# Patient Record
Sex: Female | Born: 1980 | Race: White | Hispanic: No | Marital: Married | Smoking: Never smoker
Health system: Southern US, Community
[De-identification: ages and names within clinical notes are randomized; demographics above are authoritative.]

## PROBLEM LIST (undated history)

## (undated) DIAGNOSIS — T4145XA Adverse effect of unspecified anesthetic, initial encounter: Secondary | ICD-10-CM

## (undated) DIAGNOSIS — Z87442 Personal history of urinary calculi: Secondary | ICD-10-CM

## (undated) DIAGNOSIS — R112 Nausea with vomiting, unspecified: Secondary | ICD-10-CM

## (undated) DIAGNOSIS — T8859XA Other complications of anesthesia, initial encounter: Secondary | ICD-10-CM

## (undated) DIAGNOSIS — Z9889 Other specified postprocedural states: Secondary | ICD-10-CM

## (undated) DIAGNOSIS — N209 Urinary calculus, unspecified: Secondary | ICD-10-CM

---

## 2006-06-09 ENCOUNTER — Emergency Department (HOSPITAL_COMMUNITY): Admission: EM | Admit: 2006-06-09 | Discharge: 2006-06-09 | Payer: Self-pay | Admitting: Emergency Medicine

## 2006-08-12 ENCOUNTER — Ambulatory Visit (HOSPITAL_COMMUNITY)
Admission: RE | Admit: 2006-08-12 | Discharge: 2006-08-12 | Payer: Self-pay | Admitting: Physical Medicine and Rehabilitation

## 2007-04-13 IMAGING — CR DG ANKLE COMPLETE 3+V*R*
3 series · 3 of 3 positions shown · non-contrast
Comparison: 06/09/06.

CLINICAL DATA: 25 year-old with right ankle pain.
 RIGHT ANKLE ? 3 VIEW:

[view not recorded (1 of 3)]
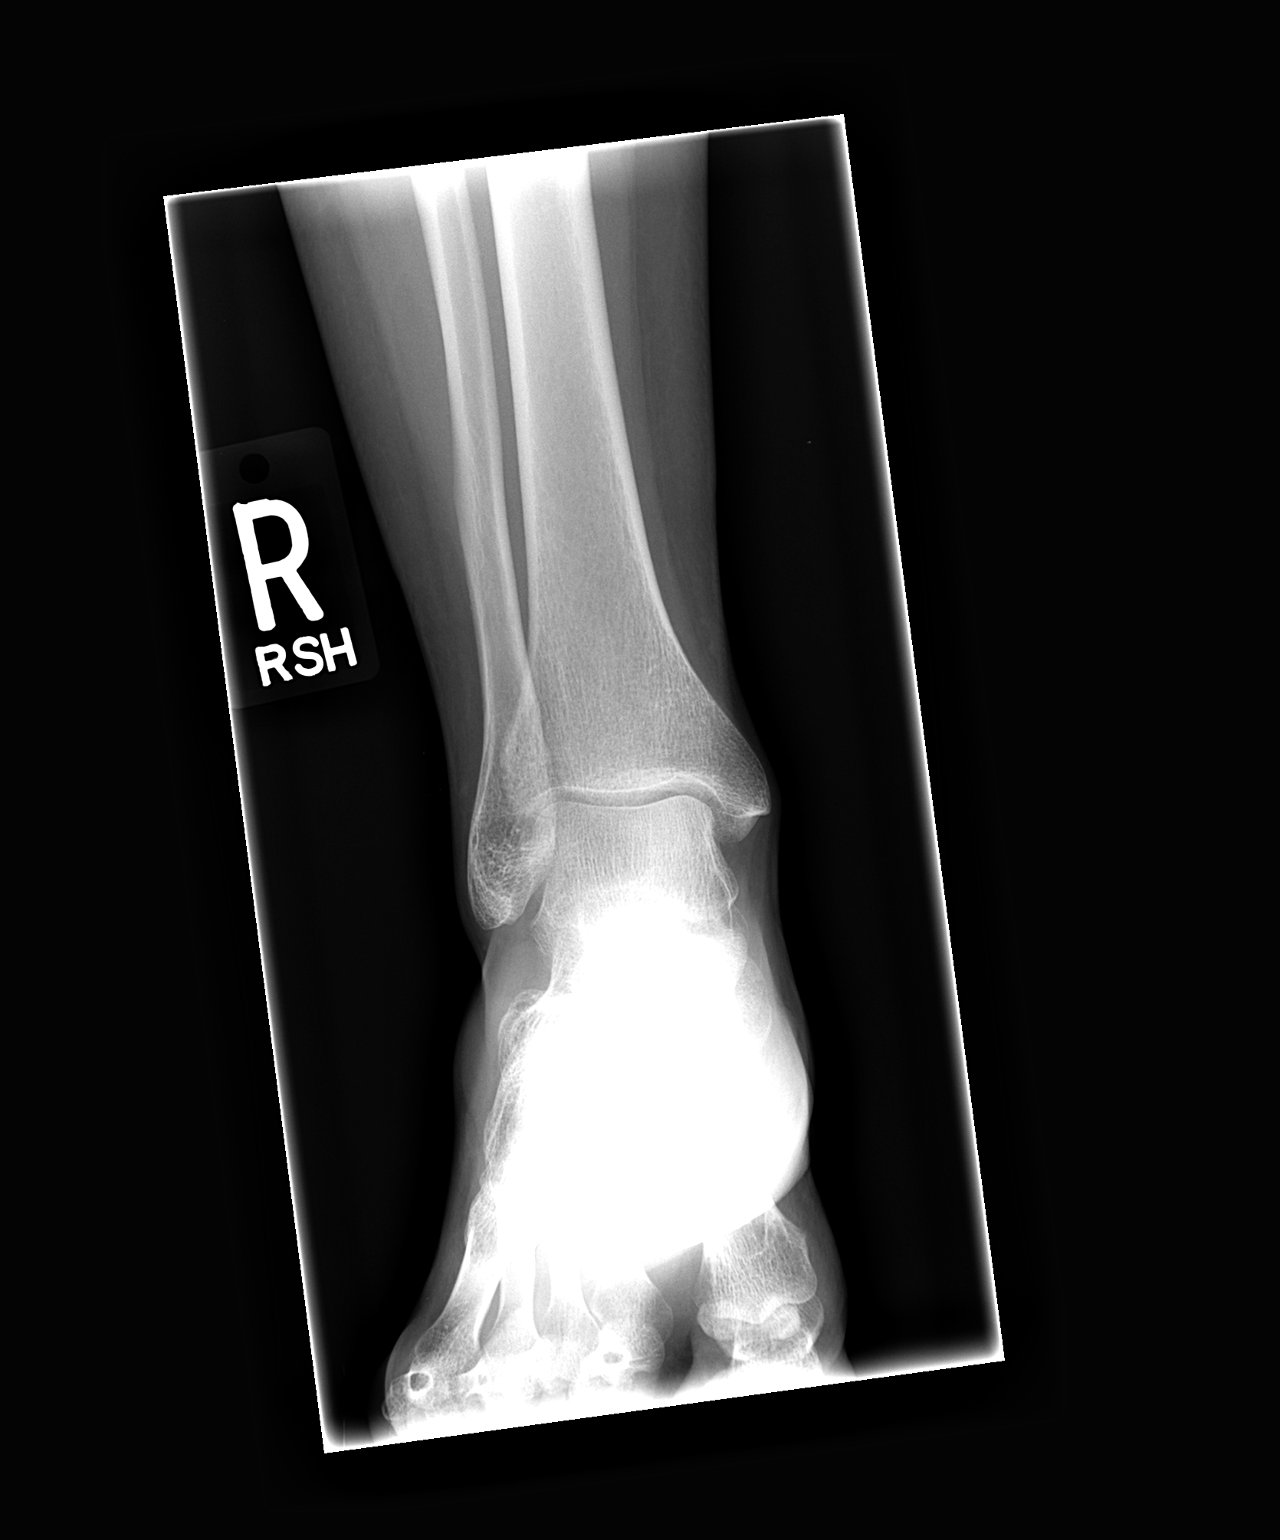

[view not recorded (2 of 3)]
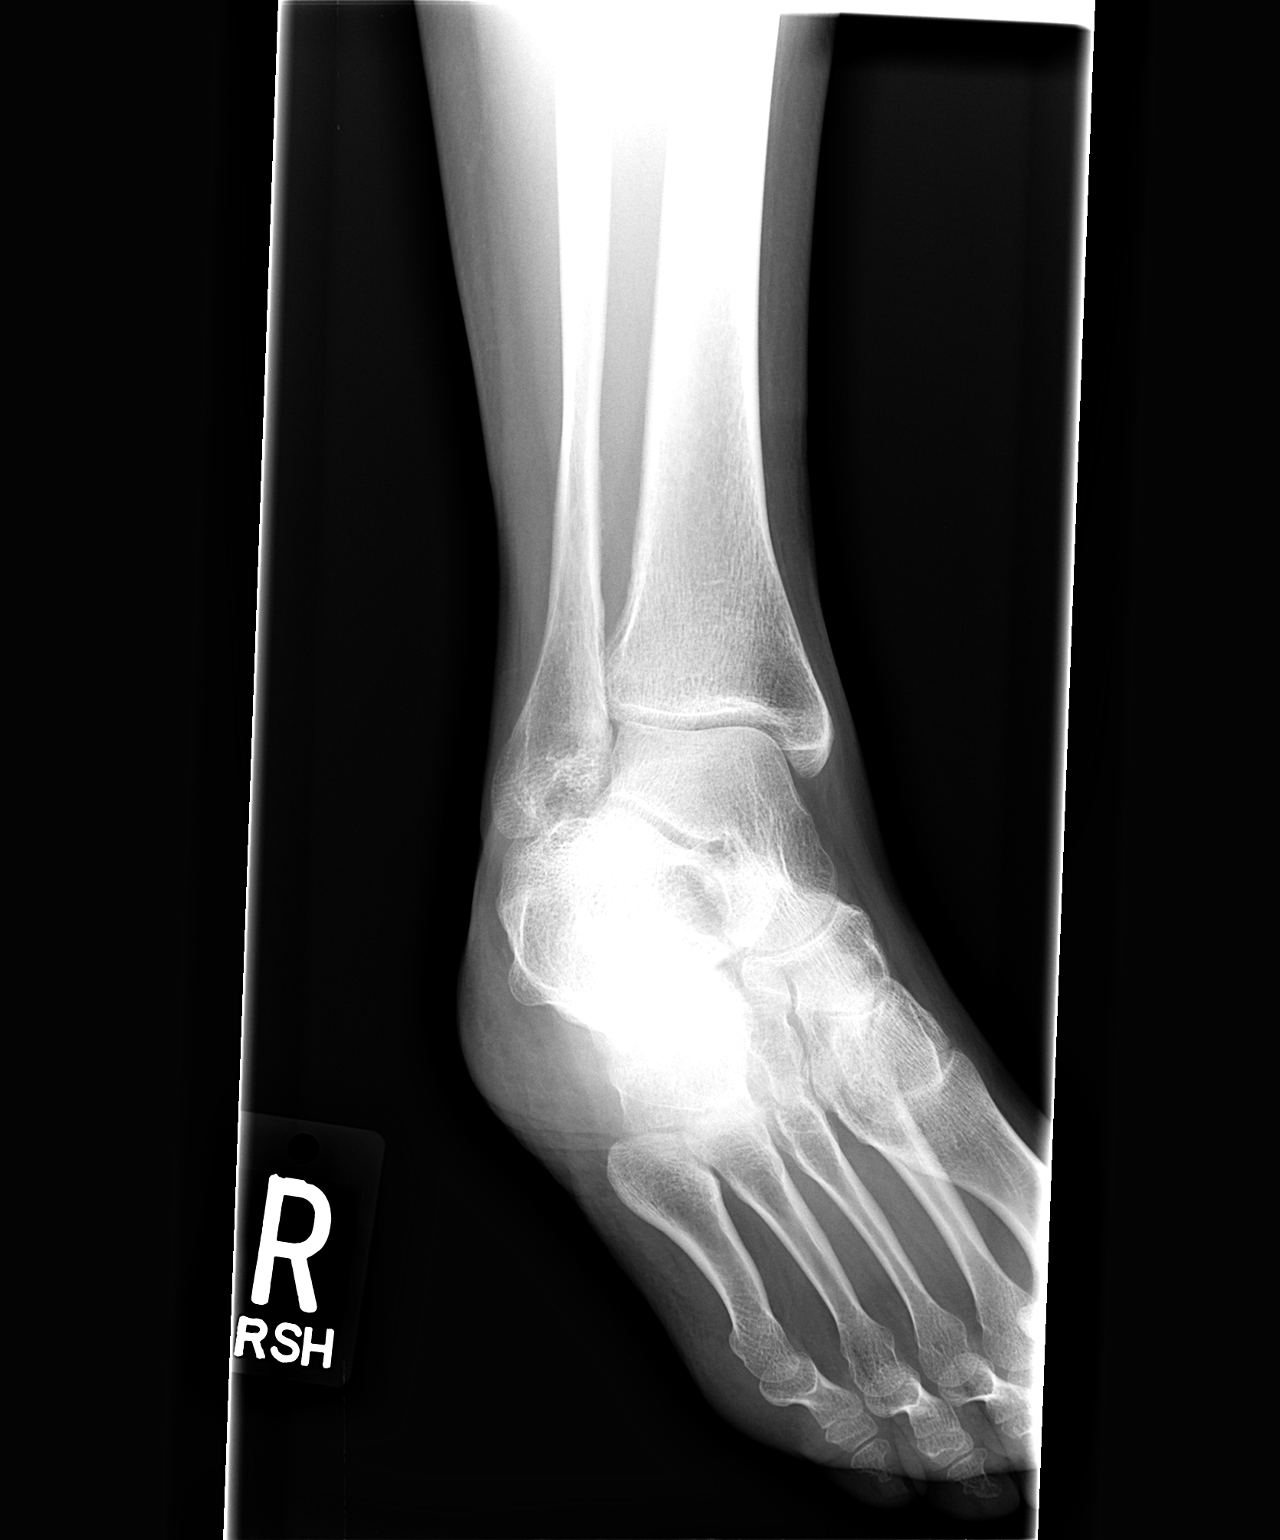

[view not recorded (3 of 3)]
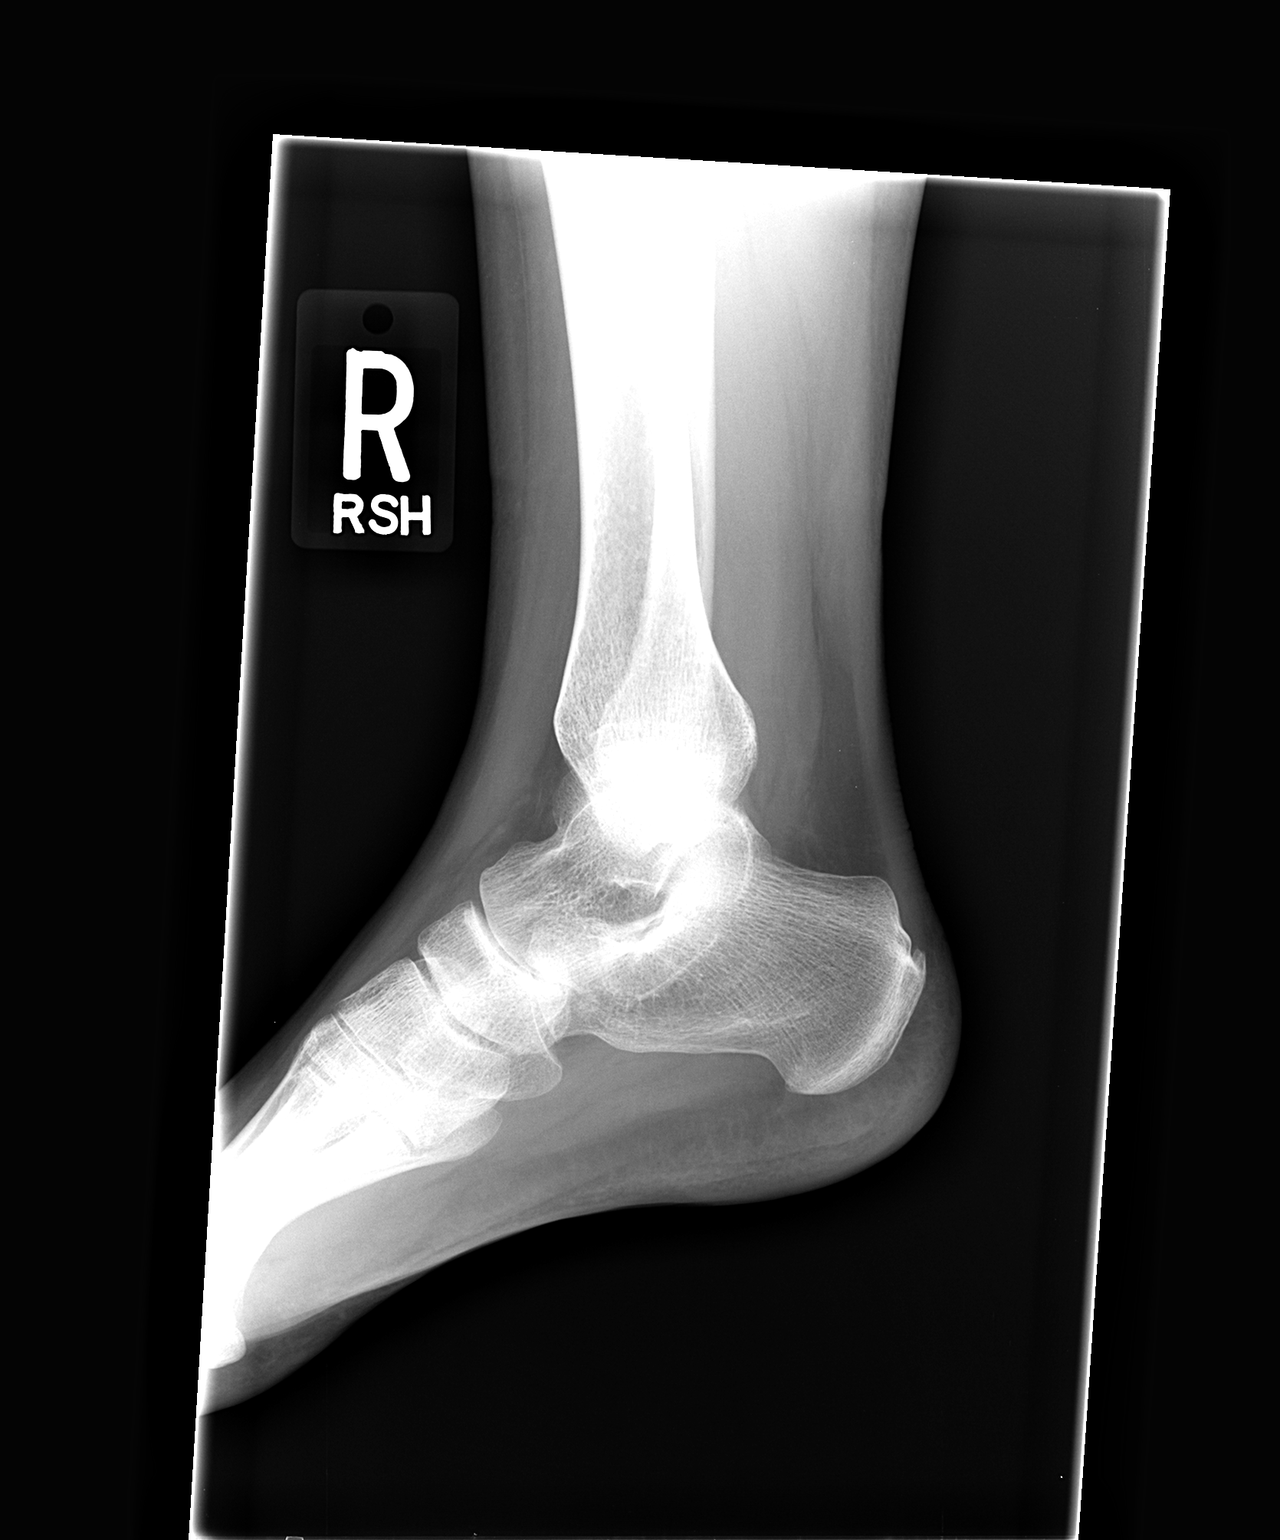

[3 of 3 positions shown; findings below may reference images not displayed]

FINDINGS: The ankle mortise is maintained.  No definite fractures are seen. No osteochondral lesions.
IMPRESSION: No acute bony findings or significant degenerative changes.

## 2018-09-20 ENCOUNTER — Other Ambulatory Visit: Payer: Self-pay

## 2018-09-20 ENCOUNTER — Emergency Department (HOSPITAL_COMMUNITY): Payer: Medicaid Other

## 2018-09-20 ENCOUNTER — Emergency Department (HOSPITAL_COMMUNITY): Payer: Medicaid Other | Admitting: Registered Nurse

## 2018-09-20 ENCOUNTER — Encounter (HOSPITAL_COMMUNITY): Admission: EM | Disposition: A | Discharge status: Home / Self Care | Payer: Self-pay | Source: Home / Self Care

## 2018-09-20 ENCOUNTER — Inpatient Hospital Stay: Admit: 2018-09-20 | Payer: Self-pay | Admitting: Urology

## 2018-09-20 ENCOUNTER — Encounter (HOSPITAL_COMMUNITY): Payer: Self-pay | Admitting: Urology

## 2018-09-20 ENCOUNTER — Observation Stay (HOSPITAL_COMMUNITY)
Admission: EM | Admit: 2018-09-20 | Discharge: 2018-09-21 | Disposition: A | Discharge status: Home / Self Care | Payer: Medicaid Other | Attending: Urology | Admitting: Urology

## 2018-09-20 DIAGNOSIS — N390 Urinary tract infection, site not specified: Secondary | ICD-10-CM | POA: Diagnosis present

## 2018-09-20 DIAGNOSIS — N136 Pyonephrosis: Secondary | ICD-10-CM | POA: Diagnosis not present

## 2018-09-20 DIAGNOSIS — N201 Calculus of ureter: Secondary | ICD-10-CM | POA: Diagnosis present

## 2018-09-20 HISTORY — DX: Urinary calculus, unspecified: N20.9

## 2018-09-20 HISTORY — PX: CYSTOSCOPY WITH RETROGRADE PYELOGRAM, URETEROSCOPY AND STENT PLACEMENT: SHX5789

## 2018-09-20 SURGERY — CYSTOURETEROSCOPY, WITH RETROGRADE PYELOGRAM AND STENT INSERTION
Anesthesia: General | Site: Ureter | Laterality: Left

## 2018-09-20 MED ORDER — MIDAZOLAM HCL 5 MG/5ML IJ SOLN
INTRAMUSCULAR | Status: DC | PRN
  Administered 2018-09-20: 2 mg via INTRAVENOUS

## 2018-09-20 MED ORDER — SODIUM CHLORIDE 0.9 % IV SOLN
INTRAVENOUS | Status: DC | PRN
  Administered 2018-09-20: 21:00:00 via INTRAVENOUS

## 2018-09-20 MED ORDER — LIDOCAINE 2% (20 MG/ML) 5 ML SYRINGE
INTRAMUSCULAR | Status: DC | PRN
  Administered 2018-09-20: 25 mg via INTRAVENOUS
  Administered 2018-09-20: 75 mg via INTRAVENOUS

## 2018-09-20 MED ORDER — PROPOFOL 10 MG/ML IV BOLUS
INTRAVENOUS | Status: AC
  Filled 2018-09-20: qty 20

## 2018-09-20 MED ORDER — SCOPOLAMINE 1 MG/3DAYS TD PT72
MEDICATED_PATCH | TRANSDERMAL | Status: AC
  Filled 2018-09-20: qty 1

## 2018-09-20 MED ORDER — LACTATED RINGERS IV SOLN: INTRAVENOUS | Status: DC

## 2018-09-20 MED ORDER — SUCCINYLCHOLINE CHLORIDE 200 MG/10ML IV SOSY
PREFILLED_SYRINGE | INTRAVENOUS | Status: DC | PRN
  Administered 2018-09-20: 140 mg via INTRAVENOUS

## 2018-09-20 MED ORDER — OXYCODONE HCL 5 MG PO TABS
5.0000 mg | ORAL_TABLET | ORAL | Status: DC | PRN
  Administered 2018-09-20 – 2018-09-21 (×2): 5 mg via ORAL
  Filled 2018-09-20 (×2): qty 1

## 2018-09-20 MED ORDER — FENTANYL CITRATE (PF) 250 MCG/5ML IJ SOLN
INTRAMUSCULAR | Status: AC
Start: 2018-09-20 — End: ?
  Filled 2018-09-20: qty 5

## 2018-09-20 MED ORDER — SCOPOLAMINE 1 MG/3DAYS TD PT72
MEDICATED_PATCH | TRANSDERMAL | Status: DC | PRN
  Administered 2018-09-20: 1 via TRANSDERMAL

## 2018-09-20 MED ORDER — ZOLPIDEM TARTRATE 5 MG PO TABS: 5.0000 mg | ORAL_TABLET | Freq: Every evening | ORAL | Status: DC | PRN

## 2018-09-20 MED ORDER — FENTANYL CITRATE (PF) 100 MCG/2ML IJ SOLN
INTRAMUSCULAR | Status: DC | PRN
  Administered 2018-09-20: 100 ug via INTRAVENOUS

## 2018-09-20 MED ORDER — PROPOFOL 10 MG/ML IV BOLUS
INTRAVENOUS | Status: DC | PRN
  Administered 2018-09-20: 200 mg via INTRAVENOUS

## 2018-09-20 MED ORDER — DIPHENHYDRAMINE HCL 12.5 MG/5ML PO ELIX: 12.5000 mg | ORAL_SOLUTION | Freq: Four times a day (QID) | ORAL | Status: DC | PRN

## 2018-09-20 MED ORDER — DIPHENHYDRAMINE HCL 50 MG/ML IJ SOLN: 12.5000 mg | Freq: Four times a day (QID) | INTRAMUSCULAR | Status: DC | PRN

## 2018-09-20 MED ORDER — BELLADONNA ALKALOIDS-OPIUM 16.2-60 MG RE SUPP
RECTAL | Status: DC | PRN
  Administered 2018-09-20: 1 via RECTAL

## 2018-09-20 MED ORDER — POTASSIUM CHLORIDE IN NACL 20-0.45 MEQ/L-% IV SOLN
INTRAVENOUS | Status: DC
  Administered 2018-09-21: via INTRAVENOUS
  Filled 2018-09-20 (×2): qty 1000

## 2018-09-20 MED ORDER — OXYBUTYNIN CHLORIDE 5 MG PO TABS: 5.0000 mg | ORAL_TABLET | Freq: Three times a day (TID) | ORAL | Status: DC | PRN

## 2018-09-20 MED ORDER — SODIUM CHLORIDE 0.9 % IV SOLN: 1.0000 g | INTRAVENOUS | Status: DC

## 2018-09-20 MED ORDER — FENTANYL CITRATE (PF) 100 MCG/2ML IJ SOLN: 25.0000 ug | INTRAMUSCULAR | Status: DC | PRN

## 2018-09-20 MED ORDER — BISACODYL 10 MG RE SUPP: 10.0000 mg | Freq: Every day | RECTAL | Status: DC | PRN

## 2018-09-20 MED ORDER — MIDAZOLAM HCL 2 MG/2ML IJ SOLN
INTRAMUSCULAR | Status: AC
  Filled 2018-09-20: qty 2

## 2018-09-20 MED ORDER — BELLADONNA ALKALOIDS-OPIUM 16.2-30 MG RE SUPP
RECTAL | Status: AC
  Filled 2018-09-20: qty 1

## 2018-09-20 MED ORDER — SENNOSIDES-DOCUSATE SODIUM 8.6-50 MG PO TABS: 1.0000 | ORAL_TABLET | Freq: Every evening | ORAL | Status: DC | PRN

## 2018-09-20 MED ORDER — HYDROMORPHONE HCL 1 MG/ML IJ SOLN: 0.5000 mg | INTRAMUSCULAR | Status: DC | PRN

## 2018-09-20 MED ORDER — METOCLOPRAMIDE HCL 5 MG/ML IJ SOLN: 10.0000 mg | Freq: Once | INTRAMUSCULAR | Status: DC | PRN

## 2018-09-20 MED ORDER — SODIUM CHLORIDE 0.9 % IR SOLN
Status: DC | PRN
  Administered 2018-09-20: 3000 mL

## 2018-09-20 MED ORDER — ACETAMINOPHEN 325 MG PO TABS: 650.0000 mg | ORAL_TABLET | ORAL | Status: DC | PRN

## 2018-09-20 MED ORDER — SUCCINYLCHOLINE CHLORIDE 200 MG/10ML IV SOSY
PREFILLED_SYRINGE | INTRAVENOUS | Status: AC
  Filled 2018-09-20: qty 10

## 2018-09-20 MED ORDER — ONDANSETRON HCL 4 MG/2ML IJ SOLN
INTRAMUSCULAR | Status: DC | PRN
  Administered 2018-09-20: 4 mg via INTRAVENOUS

## 2018-09-20 MED ORDER — DEXAMETHASONE SODIUM PHOSPHATE 10 MG/ML IJ SOLN
INTRAMUSCULAR | Status: DC | PRN
  Administered 2018-09-20: 10 mg via INTRAVENOUS

## 2018-09-20 MED ORDER — LIDOCAINE 2% (20 MG/ML) 5 ML SYRINGE
INTRAMUSCULAR | Status: AC
  Filled 2018-09-20: qty 5

## 2018-09-20 MED ORDER — FLEET ENEMA 7-19 GM/118ML RE ENEM: 1.0000 | ENEMA | Freq: Once | RECTAL | Status: DC | PRN

## 2018-09-20 MED ORDER — ONDANSETRON HCL 4 MG/2ML IJ SOLN: 4.0000 mg | INTRAMUSCULAR | Status: DC | PRN

## 2018-09-20 MED ORDER — SODIUM CHLORIDE 0.9 % IR SOLN
Status: DC | PRN
  Administered 2018-09-20: 1000 mL

## 2018-09-20 MED ORDER — DEXAMETHASONE SODIUM PHOSPHATE 10 MG/ML IJ SOLN
INTRAMUSCULAR | Status: AC
  Filled 2018-09-20: qty 1

## 2018-09-20 MED ORDER — ONDANSETRON HCL 4 MG/2ML IJ SOLN
INTRAMUSCULAR | Status: AC
  Filled 2018-09-20: qty 2

## 2018-09-20 MED ORDER — MEPERIDINE HCL 50 MG/ML IJ SOLN: 6.2500 mg | INTRAMUSCULAR | Status: DC | PRN

## 2018-09-20 SURGICAL SUPPLY — 28 items
BAG URINE DRAINAGE (UROLOGICAL SUPPLIES) ×2 IMPLANT
BAG URO CATCHER STRL LF (MISCELLANEOUS) ×3 IMPLANT
BASKET STONE NCOMPASS (UROLOGICAL SUPPLIES) IMPLANT
CATH FOLEY 2WAY 5CC 16FR (CATHETERS) ×3
CATH URET 5FR 28IN OPEN ENDED (CATHETERS) IMPLANT
CATH URET DUAL LUMEN 6-10FR 50 (CATHETERS) ×1 IMPLANT
CATH URTH STD 16FR FL 2W DRN (CATHETERS) IMPLANT
CLOTH BEACON ORANGE TIMEOUT ST (SAFETY) ×3 IMPLANT
COVER WAND RF STERILE (DRAPES) IMPLANT
EXTRACTOR STONE NITINOL NGAGE (UROLOGICAL SUPPLIES) ×1 IMPLANT
FIBER LASER FLEXIVA 1000 (UROLOGICAL SUPPLIES) IMPLANT
FIBER LASER FLEXIVA 365 (UROLOGICAL SUPPLIES) IMPLANT
FIBER LASER FLEXIVA 550 (UROLOGICAL SUPPLIES) IMPLANT
FIBER LASER TRAC TIP (UROLOGICAL SUPPLIES) IMPLANT
GLOVE SURG SS PI 8.0 STRL IVOR (GLOVE) IMPLANT
GOWN STRL REUS W/TWL XL LVL3 (GOWN DISPOSABLE) ×3 IMPLANT
GUIDEWIRE STR DUAL SENSOR (WIRE) ×3 IMPLANT
IV NS 1000ML (IV SOLUTION) ×3
IV NS 1000ML BAXH (IV SOLUTION) ×1 IMPLANT
IV NS IRRIG 3000ML ARTHROMATIC (IV SOLUTION) ×3 IMPLANT
KIT TURNOVER KIT A (KITS) IMPLANT
MANIFOLD NEPTUNE II (INSTRUMENTS) ×3 IMPLANT
PACK CYSTO (CUSTOM PROCEDURE TRAY) ×3 IMPLANT
SHEATH URETERAL 12FRX35CM (MISCELLANEOUS) ×1 IMPLANT
STENT POLARIS 5FRX24 (STENTS) ×2 IMPLANT
TUBING CONNECTING 10 (TUBING) ×2 IMPLANT
TUBING CONNECTING 10' (TUBING) ×1
TUBING UROLOGY SET (TUBING) ×3 IMPLANT

## 2018-09-20 NOTE — Transfer of Care (Signed)
Immediate Anesthesia Transfer of Care Note  Patient: Linda Nash  Procedure(s) Performed: CYSTOSCOPY WITH RETROGRADE PYELOGRAM,  AND STENT PLACEMENT (Left Ureter)  Patient Location: PACU  Anesthesia Type:General  Level of Consciousness: awake, alert , oriented and patient cooperative  Airway & Oxygen Therapy: Patient Spontanous Breathing and Patient connected to face mask oxygen  Post-op Assessment: Report given to RN, Post -op Vital signs reviewed and stable and Patient moving all extremities X 4  Post vital signs: stable  Last Vitals:  Vitals Value Taken Time  BP 115/55 09/20/2018 10:31 PM  Temp    Pulse 96 09/20/2018 10:34 PM  Resp 16 09/20/2018 10:34 PM  SpO2 100 % 09/20/2018 10:34 PM  Vitals shown include unvalidated device data.  Last Pain: There were no vitals filed for this visit.       Complications: No apparent anesthesia complications

## 2018-09-20 NOTE — H&P (Addendum)
Subjective: CC: Left flank pain.  Hx: Linda Nash is a 38 yo WF who is sent from [facility] ED with a 67mm left proximal stone with severe pain that began today and was associated with hematuria, nausea and vomiting.  She has a low grade fever and a leukocytosis with a WBC count of 18.9 and an infected looking UA.  She got rocephin in the ED.  She had some mild intermittent discomfort for the last 2 months.  She has passed a stone before but has no other GU history.  She got morphine, toradol and rocephin in the ED.  ROS:  Review of Systems  Constitutional: Positive for fever.  Gastrointestinal: Positive for nausea and vomiting.  Genitourinary: Positive for flank pain and hematuria.  All other systems reviewed and are negative.   Allergies: Betadine.  Past Medical History:  Diagnosis Date  . Urolithiasis     Past Surgical History:  Procedure Laterality Date  . CESAREAN SECTION    . REPEAT CESAREAN SECTION    . REPEAT CESAREAN SECTION      Social History   Socioeconomic History  . Marital status: Married    Spouse name: Not on file  . Number of children: Not on file  . Years of education: Not on file  . Highest education level: Not on file  Occupational History  . Not on file  Social Needs  . Financial resource strain: Not on file  . Food insecurity:    Worry: Not on file    Inability: Not on file  . Transportation needs:    Medical: Not on file    Non-medical: Not on file  Tobacco Use  . Smoking status: Never Smoker  . Smokeless tobacco: Never Used  Substance and Sexual Activity  . Alcohol use: Not Currently  . Drug use: Not on file  . Sexual activity: Not on file  Lifestyle  . Physical activity:    Days per week: Not on file    Minutes per session: Not on file  . Stress: Not on file  Relationships  . Social connections:    Talks on phone: Not on file    Gets together: Not on file    Attends religious service: Not on file    Active member of club or  organization: Not on file    Attends meetings of clubs or organizations: Not on file    Relationship status: Not on file  . Intimate partner violence:    Fear of current or ex partner: Not on file    Emotionally abused: Not on file    Physically abused: Not on file    Forced sexual activity: Not on file  Other Topics Concern  . Not on file  Social History Narrative  . Not on file    Family History  Problem Relation Age of Onset  . Urolithiasis Father     Anti-infectives: Anti-infectives (From admission, onward)   None    Rocephin   Current Facility-Administered Medications  Medication Dose Route Frequency Provider Last Rate Last Dose  . scopolamine (TRANSDERM-SCOP) 1 MG/3DAYS            No current outpatient medications on file.     Objective: Vital signs in last 24 hours: T-99.4   BP-127/70   P-74   R-22    Intake/Output from previous day: No intake/output data recorded. Intake/Output this shift: No intake/output data recorded.   Physical Exam Constitutional:      Appearance: Normal appearance.  HENT:     Head: Normocephalic and atraumatic.  Cardiovascular:     Rate and Rhythm: Normal rate and regular rhythm.  Pulmonary:     Effort: Pulmonary effort is normal. No respiratory distress.     Breath sounds: Normal breath sounds.  Abdominal:     General: Abdomen is flat.     Tenderness: There is left CVA tenderness.  Musculoskeletal: Normal range of motion.        General: No swelling or tenderness.  Lymphadenopathy:     Cervical: Cervical adenopathy (left SCM node that is chronic. ) present.  Skin:    General: Skin is warm and dry.  Neurological:     General: No focal deficit present.     Mental Status: She is alert and oriented to person, place, and time.  Psychiatric:        Mood and Affect: Mood normal.        Behavior: Behavior normal.     Lab Results:  No results for input(s): WBC, HGB, HCT, PLT in the last 72 hours. BMET No results for  input(s): NA, K, CL, CO2, GLUCOSE, BUN, CREATININE, CALCIUM in the last 72 hours. PT/INR No results for input(s): LABPROT, INR in the last 72 hours. ABG No results for input(s): PHART, HCO3 in the last 72 hours.  Invalid input(s): PCO2, PO2  Studies/Results: No results found.  I have reviewed her CT films from the ED and her CBC, BMP and UA.  I discussed the case with the EDP.    Assessment: Left proximal stone with infection.   I will take her to the OR for cystoscopy and left ureteral stent insertion.  Risks of bleeding, infection, ureteral injury, need for secondary procedures, thrombotic events and anesthetic complications.        Linda Nash 09/20/2018 [phone]  

## 2018-09-20 NOTE — Discharge Instructions (Signed)
Ureteral Stent Implantation, Care After °Refer to this sheet in the next few weeks. These instructions provide you with information about caring for yourself after your procedure. Your health care provider may also give you more specific instructions. Your treatment has been planned according to current medical practices, but problems sometimes occur. Call your health care provider if you have any problems or questions after your procedure. °What can I expect after the procedure? °After the procedure, it is common to have: °· Nausea. °· Mild pain when you urinate. You may feel this pain in your lower back or lower abdomen. Pain should stop within a few minutes after you urinate. This may last for up to 1 week. °· A small amount of blood in your urine for several days. °Follow these instructions at home: ° °Medicines °· Take over-the-counter and prescription medicines only as told by your health care provider. °· If you were prescribed an antibiotic medicine, take it as told by your health care provider. Do not stop taking the antibiotic even if you start to feel better. °· Do not drive for 24 hours if you received a sedative. °· Do not drive or operate heavy machinery while taking prescription pain medicines. °Activity °· Return to your normal activities as told by your health care provider. Ask your health care provider what activities are safe for you. °· Do not lift anything that is heavier than 10 lb (4.5 kg). Follow this limit for 1 week after your procedure, or for as long as told by your health care provider. °General instructions °· Watch for any blood in your urine. Call your health care provider if the amount of blood in your urine increases. °· If you have a catheter: °? Follow instructions from your health care provider about taking care of your catheter and collection bag. °? Do not take baths, swim, or use a hot tub until your health care provider approves. °· Drink enough fluid to keep your urine  clear or pale yellow. °· Keep all follow-up visits as told by your health care provider. This is important. °Contact a health care provider if: °· You have pain that gets worse or does not get better with medicine, especially pain when you urinate. °· You have difficulty urinating. °· You feel nauseous or you vomit repeatedly during a period of more than 2 days after the procedure. °Get help right away if: °· Your urine is dark red or has blood clots in it. °· You are leaking urine (have incontinence). °· The end of the stent comes out of your urethra. °· You cannot urinate. °· You have sudden, sharp, or severe pain in your abdomen or lower back. °· You have a fever. °This information is not intended to replace advice given to you by your health care provider. Make sure you discuss any questions you have with your health care provider. °Document Released: 03/02/2013 Document Revised: 12/06/2015 Document Reviewed: 01/12/2015 °Elsevier Interactive Patient Education © 2019 [employer] ° °

## 2018-09-20 NOTE — Op Note (Signed)
Procedure: 1.  Cystoscopy with left retrograde pyelogram and interpretation. 2.  Insertion of left double-J stent.  Preop diagnosis: Left proximal ureteral stone with infection.  Postop diagnosis: Same.  Surgeon: Dr. Bjorn Pippin.  Anesthesia: General.  Specimen: Left renal pelvic urine culture.  Drains: 5 x 24 left Polaris stent and 16 French Foley.  EBL: None.  Complications: None.  Indications: Linda Nash is a 38 year old white female who was sent from [facility] emergency room with a 7 mm left proximal ureteral stone, leukocytosis and low-grade fever.  It was felt that she needed urgent ureteral stent insertion.  Procedure: She had received 2 g of Rocephin in the emergency room.  She was taken operating room where general anesthetic was induced.  She was placed in lithotomy position and fitted with PAS hose.  Her perineum and genitalia were prepped with Hibiclens.  She was draped in usual sterile fashion.  Cystoscopy was performed using a 23 [place] scope and 30 degree lens.  Examination revealed a normal urethra.  The bladder wall was smooth and pale without tumor stones or inflammation.  Ureteral orifice ease were unremarkable.  Urine was somewhat turbid and irrigation was required to clear the view.  A left retrograde pyelogram was performed with a 5 French opening catheter and Omnipaque.  The left retrograde pyelogram revealed a delicate but otherwise normal ureter up to the proximal ureter just below the UPJ where her stone was noted as a filling defect.  There was proximal hydronephrosis.  A sensor guidewire was passed through the opening catheter to the kidney by the stone and then the 5 French opening catheter was advanced over the wire into the kidney.  There was some resistance at the level of the stone but I was able to negotiate it into the kidney.  The wire was removed and a specimen was obtained.  The urine was turbid and a small amount was collected to send for  culture.  The sensor wire was replaced and the open-ended catheter was removed.  A 5 French by 24 cm Polaris stent was passed without difficulty to the kidney under fluoroscopic guidance.  The wire was removed leaving a good coil in the kidney and the loops in the bladder.  The cystoscope was removed and a 16 French Foley catheter was inserted.  The balloon was filled with 10 mL of sterile fluid and the catheter was placed to straight drainage.  A B&O suppository was placed.  She was taken down for the lithotomy position, her anesthetic was reversed and she was moved recovery in stable condition.  There were no complications.  She will be admitted for overnight observation.

## 2018-09-20 NOTE — Anesthesia Procedure Notes (Signed)
Procedure Name: Intubation Date/Time: 09/20/2018 10:07 PM Performed by: Lissa Morales, CRNA Pre-anesthesia Checklist: Patient identified, Emergency Drugs available, Suction available and Patient being monitored Patient Re-evaluated:Patient Re-evaluated prior to induction Oxygen Delivery Method: Circle system utilized Preoxygenation: Pre-oxygenation with 100% oxygen Induction Type: IV induction, Rapid sequence and Cricoid Pressure applied Laryngoscope Size: Mac and 4 Grade View: Grade II Tube type: Oral Tube size: 7.0 mm Number of attempts: 1 Airway Equipment and Method: Stylet and Oral airway Placement Confirmation: ETT inserted through vocal cords under direct vision,  positive ETCO2 and breath sounds checked- equal and bilateral Secured at: 19 cm Tube secured with: Tape Dental Injury: Teeth and Oropharynx as per pre-operative assessment

## 2018-09-20 NOTE — Anesthesia Preprocedure Evaluation (Signed)
Anesthesia Evaluation  Patient identified by MRN, date of birth, ID band Patient awake    Reviewed: Allergy & Precautions, NPO status , Patient's Chart, lab work & pertinent test results  Airway Mallampati: II  TM Distance: >3 FB Neck ROM: Full    Dental no notable dental hx.    Pulmonary neg pulmonary ROS,    Pulmonary exam normal breath sounds clear to auscultation       Cardiovascular negative cardio ROS Normal cardiovascular exam Rhythm:Regular Rate:Normal     Neuro/Psych negative neurological ROS  negative psych ROS   GI/Hepatic negative GI ROS, Neg liver ROS,   Endo/Other  negative endocrine ROS  Renal/GU negative Renal ROS  negative genitourinary   Musculoskeletal negative musculoskeletal ROS (+)   Abdominal   Peds negative pediatric ROS (+)  Hematology negative hematology ROS (+)   Anesthesia Other Findings   Reproductive/Obstetrics negative OB ROS                             Anesthesia Physical Anesthesia Plan  ASA: II and emergent  Anesthesia Plan: General   Post-op Pain Management:    Induction: Intravenous  PONV Risk Score and Plan: 3 and Ondansetron, Dexamethasone and Treatment may vary due to age or medical condition  Airway Management Planned: LMA  Additional Equipment:   Intra-op Plan:   Post-operative Plan:   Informed Consent: I have reviewed the patients History and Physical, chart, labs and discussed the procedure including the risks, benefits and alternatives for the proposed anesthesia with the patient or authorized representative who has indicated his/her understanding and acceptance.     Dental advisory given  Plan Discussed with: CRNA  Anesthesia Plan Comments:         Anesthesia Quick Evaluation

## 2018-09-20 NOTE — H&P (View-Only) (Signed)
Subjective: CC: Left flank pain.  Hx: Linda Nash is a 38 yo WF who is sent from [facility] ED with a 67mm left proximal stone with severe pain that began today and was associated with hematuria, nausea and vomiting.  She has a low grade fever and a leukocytosis with a WBC count of 18.9 and an infected looking UA.  She got rocephin in the ED.  She had some mild intermittent discomfort for the last 2 months.  She has passed a stone before but has no other GU history.  She got morphine, toradol and rocephin in the ED.  ROS:  Review of Systems  Constitutional: Positive for fever.  Gastrointestinal: Positive for nausea and vomiting.  Genitourinary: Positive for flank pain and hematuria.  All other systems reviewed and are negative.   Allergies: Betadine.  Past Medical History:  Diagnosis Date  . Urolithiasis     Past Surgical History:  Procedure Laterality Date  . CESAREAN SECTION    . REPEAT CESAREAN SECTION    . REPEAT CESAREAN SECTION      Social History   Socioeconomic History  . Marital status: Married    Spouse name: Not on file  . Number of children: Not on file  . Years of education: Not on file  . Highest education level: Not on file  Occupational History  . Not on file  Social Needs  . Financial resource strain: Not on file  . Food insecurity:    Worry: Not on file    Inability: Not on file  . Transportation needs:    Medical: Not on file    Non-medical: Not on file  Tobacco Use  . Smoking status: Never Smoker  . Smokeless tobacco: Never Used  Substance and Sexual Activity  . Alcohol use: Not Currently  . Drug use: Not on file  . Sexual activity: Not on file  Lifestyle  . Physical activity:    Days per week: Not on file    Minutes per session: Not on file  . Stress: Not on file  Relationships  . Social connections:    Talks on phone: Not on file    Gets together: Not on file    Attends religious service: Not on file    Active member of club or  organization: Not on file    Attends meetings of clubs or organizations: Not on file    Relationship status: Not on file  . Intimate partner violence:    Fear of current or ex partner: Not on file    Emotionally abused: Not on file    Physically abused: Not on file    Forced sexual activity: Not on file  Other Topics Concern  . Not on file  Social History Narrative  . Not on file    Family History  Problem Relation Age of Onset  . Urolithiasis Father     Anti-infectives: Anti-infectives (From admission, onward)   None    Rocephin   Current Facility-Administered Medications  Medication Dose Route Frequency Provider Last Rate Last Dose  . scopolamine (TRANSDERM-SCOP) 1 MG/3DAYS            No current outpatient medications on file.     Objective: Vital signs in last 24 hours: T-99.4   BP-127/70   P-74   R-22    Intake/Output from previous day: No intake/output data recorded. Intake/Output this shift: No intake/output data recorded.   Physical Exam Constitutional:      Appearance: Normal appearance.  HENT:     Head: Normocephalic and atraumatic.  Cardiovascular:     Rate and Rhythm: Normal rate and regular rhythm.  Pulmonary:     Effort: Pulmonary effort is normal. No respiratory distress.     Breath sounds: Normal breath sounds.  Abdominal:     General: Abdomen is flat.     Tenderness: There is left CVA tenderness.  Musculoskeletal: Normal range of motion.        General: No swelling or tenderness.  Lymphadenopathy:     Cervical: Cervical adenopathy (left SCM node that is chronic. ) present.  Skin:    General: Skin is warm and dry.  Neurological:     General: No focal deficit present.     Mental Status: She is alert and oriented to person, place, and time.  Psychiatric:        Mood and Affect: Mood normal.        Behavior: Behavior normal.     Lab Results:  No results for input(s): WBC, HGB, HCT, PLT in the last 72 hours. BMET No results for  input(s): NA, K, CL, CO2, GLUCOSE, BUN, CREATININE, CALCIUM in the last 72 hours. PT/INR No results for input(s): LABPROT, INR in the last 72 hours. ABG No results for input(s): PHART, HCO3 in the last 72 hours.  Invalid input(s): PCO2, PO2  Studies/Results: No results found.  I have reviewed her CT films from the ED and her CBC, BMP and UA.  I discussed the case with the EDP.    Assessment: Left proximal stone with infection.   I will take her to the OR for cystoscopy and left ureteral stent insertion.  Risks of bleeding, infection, ureteral injury, need for secondary procedures, thrombotic events and anesthetic complications.        Bjorn Pippin 09/20/2018 [phone]

## 2018-09-21 ENCOUNTER — Encounter (HOSPITAL_COMMUNITY): Payer: Self-pay | Admitting: Urology

## 2018-09-21 DIAGNOSIS — N136 Pyonephrosis: Secondary | ICD-10-CM | POA: Diagnosis not present

## 2018-09-21 DIAGNOSIS — N390 Urinary tract infection, site not specified: Secondary | ICD-10-CM | POA: Diagnosis present

## 2018-09-21 LAB — CBC
HCT: 38.7 % (ref 36.0–46.0)
Hemoglobin: 11.5 g/dL — ABNORMAL LOW (ref 12.0–15.0)
MCH: 24.5 pg — AB (ref 26.0–34.0)
MCHC: 29.7 g/dL — ABNORMAL LOW (ref 30.0–36.0)
MCV: 82.3 fL (ref 80.0–100.0)
Platelets: 354 10*3/uL (ref 150–400)
RBC: 4.7 MIL/uL (ref 3.87–5.11)
RDW: 14 % (ref 11.5–15.5)
WBC: 18.5 10*3/uL — ABNORMAL HIGH (ref 4.0–10.5)
nRBC: 0 % (ref 0.0–0.2)

## 2018-09-21 LAB — BASIC METABOLIC PANEL
Anion gap: 7 (ref 5–15)
BUN: 11 mg/dL (ref 6–20)
CO2: 21 mmol/L — AB (ref 22–32)
Calcium: 9 mg/dL (ref 8.9–10.3)
Chloride: 108 mmol/L (ref 98–111)
Creatinine, Ser: 0.85 mg/dL (ref 0.44–1.00)
GFR calc non Af Amer: >60 mL/min (ref >60)
GLUCOSE: 156 mg/dL — AB (ref 70–99)
Potassium: 4.3 mmol/L (ref 3.5–5.1)
Sodium: 136 mmol/L (ref 135–145)

## 2018-09-21 LAB — HIV ANTIBODY (ROUTINE TESTING W REFLEX): HIV Screen 4th Generation wRfx: NONREACTIVE

## 2018-09-21 MED ORDER — CEFDINIR 300 MG PO CAPS: 300.0000 mg | ORAL_CAPSULE | Freq: Two times a day (BID) | ORAL | 0 refills | Status: AC

## 2018-09-21 MED ORDER — HYDROCODONE-ACETAMINOPHEN 5-325 MG PO TABS: 1.0000 | ORAL_TABLET | Freq: Four times a day (QID) | ORAL | 0 refills | Status: AC | PRN

## 2018-09-21 MED ORDER — ONDANSETRON HCL 4 MG PO TABS: 4.0000 mg | ORAL_TABLET | Freq: Three times a day (TID) | ORAL | 1 refills | Status: AC | PRN

## 2018-09-21 MED ORDER — PHENAZOPYRIDINE HCL 200 MG PO TABS: 200.0000 mg | ORAL_TABLET | Freq: Three times a day (TID) | ORAL | 1 refills | Status: AC | PRN

## 2018-09-21 NOTE — Discharge Summary (Signed)
Physician Discharge Summary  Patient ID: Linda Nash MRN: [id number] DOB/AGE: Jul 24, 1980 38 y.o.  Admit date: 09/20/2018 Discharge date: 09/21/2018  Admission Diagnoses:  Left ureteral stone  Discharge Diagnoses:  Principal Problem:   Left ureteral stone Active Problems:   Urinary tract infection   Past Medical History:  Diagnosis Date  . Urolithiasis     Surgeries: Procedure(s): CYSTOSCOPY WITH RETROGRADE PYELOGRAM,  AND STENT PLACEMENT on 09/20/2018   Consultants (if any):   Discharged Condition: Improved  Hospital Course: Linda Nash is an 38 y.o. female who was admitted 09/20/2018 with a diagnosis of Left ureteral stone and went to the operating room on 09/20/2018 and underwent the above named procedures.  She did well post op and her foley was removed on POD#1.  She is afebrile and has minimal pain.  Her WBC is falling and her Cr is normal.  She will be d/c'd home on oral Cefdinir and I will arrange ureteroscopy in 2-3 weeks.    She was given perioperative antibiotics:  Anti-infectives (From admission, onward)   Start     Dose/Rate Route Frequency Ordered Stop   09/21/18 1700  cefTRIAXone (ROCEPHIN) 1 g in sodium chloride 0.9 % 100 mL IVPB    Note to Pharmacy:  She was dosed with 2 gm at [facility] on 3/9 at 1700.   1 g 200 mL/hr over 30 Minutes Intravenous Every 24 hours 09/20/18 2337     09/21/18 0000  cefdinir (OMNICEF) 300 MG capsule     300 mg Oral 2 times daily 09/21/18 0727      .  She was given sequential compression devices, for DVT prophylaxis.  She benefited maximally from the hospital stay and there were no complications.    Recent vital signs:  Vitals:   09/20/18 2332 09/21/18 0508  BP: 116/60 (!) 105/56  Pulse: 71 60  Resp: 18 18  Temp: 99.6 F (37.6 C) 98.4 F (36.9 C)  SpO2: 100% 99%    Recent laboratory studies:  Lab Results  Component Value Date   HGB 11.5 (L) 09/21/2018   Lab Results  Component Value Date   WBC 18.5 (H)  09/21/2018   PLT 354 09/21/2018   No results found for: INR Lab Results  Component Value Date   NA 136 09/21/2018   K 4.3 09/21/2018   CL 108 09/21/2018   CO2 21 (L) 09/21/2018   BUN 11 09/21/2018   CREATININE 0.85 09/21/2018   GLUCOSE 156 (H) 09/21/2018    Discharge Medications:   Allergies as of 09/21/2018      Reactions   Iodine Rash   Skin rash after contact with skin      Medication List    TAKE these medications   cefdinir 300 MG capsule Commonly known as:  OMNICEF Take 1 capsule (300 mg total) by mouth 2 (two) times daily.   HYDROcodone-acetaminophen 5-325 MG tablet Commonly known as:  Norco Take 1 tablet by mouth every 6 (six) hours as needed for moderate pain.   ibuprofen 200 MG tablet Commonly known as:  ADVIL,MOTRIN Take 400 mg by mouth every 6 (six) hours as needed for headache, mild pain or moderate pain.   ondansetron 4 MG tablet Commonly known as:  Zofran Take 1 tablet (4 mg total) by mouth every 8 (eight) hours as needed for nausea or vomiting.   phenazopyridine 200 MG tablet Commonly known as:  Pyridium Take 1 tablet (200 mg total) by mouth 3 (three) times daily as  needed for pain.       Diagnostic Studies: Dg C-arm 1-60 Min-no Report  Result Date: 09/20/2018 Fluoroscopy was utilized by the requesting physician.  No radiographic interpretation.    Disposition: Discharge disposition: 01-Home or Self Care       Discharge Instructions    Discharge patient   Complete by:  As directed    Discharge disposition:  01-Home or Self Care   Discharge patient date:  09/21/2018   Discontinue IV   Complete by:  As directed    Foley catheter - discontinue   Complete by:  As directed       Follow-up Information    Bjorn Pippin, MD.   Specialty:  Urology Why:  I will arrange your next procedure and have the office contact you.  Contact information: [address] [place] [address] [phone]            Signed: Bjorn Pippin 09/21/2018, 7:30 AM

## 2018-09-22 NOTE — Anesthesia Postprocedure Evaluation (Signed)
Anesthesia Post Note  Patient: Linda Nash  Procedure(s) Performed: CYSTOSCOPY WITH RETROGRADE PYELOGRAM,  AND STENT PLACEMENT (Left Ureter)     Patient location during evaluation: PACU Anesthesia Type: General Level of consciousness: awake and alert Pain management: pain level controlled Vital Signs Assessment: post-procedure vital signs reviewed and stable Respiratory status: spontaneous breathing, nonlabored ventilation, respiratory function stable and patient connected to nasal cannula oxygen Cardiovascular status: blood pressure returned to baseline and stable Postop Assessment: no apparent nausea or vomiting Anesthetic complications: no    Last Vitals:  Vitals:   09/20/18 2332 09/21/18 0508  BP: 116/60 (!) 105/56  Pulse: 71 60  Resp: 18 18  Temp: 37.6 C 36.9 C  SpO2: 100% 99%    Last Pain:  Vitals:   09/21/18 0735  TempSrc:   PainSc: 1                  Phillips Grout

## 2018-09-23 ENCOUNTER — Other Ambulatory Visit: Payer: Self-pay | Admitting: Urology

## 2018-09-23 LAB — URINE CULTURE

## 2018-09-28 ENCOUNTER — Other Ambulatory Visit: Payer: Self-pay

## 2018-09-28 ENCOUNTER — Encounter (HOSPITAL_COMMUNITY)
Admission: RE | Admit: 2018-09-28 | Discharge: 2018-09-28 | Disposition: A | Discharge status: Home / Self Care | Payer: Medicaid Other | Source: Ambulatory Visit | Attending: Urology | Admitting: Urology

## 2018-09-28 ENCOUNTER — Encounter (HOSPITAL_COMMUNITY): Payer: Self-pay

## 2018-09-28 DIAGNOSIS — Z01812 Encounter for preprocedural laboratory examination: Secondary | ICD-10-CM | POA: Insufficient documentation

## 2018-09-28 DIAGNOSIS — N200 Calculus of kidney: Secondary | ICD-10-CM | POA: Insufficient documentation

## 2018-09-28 HISTORY — DX: Personal history of urinary calculi: Z87.442

## 2018-09-28 HISTORY — DX: Other complications of anesthesia, initial encounter: T88.59XA

## 2018-09-28 HISTORY — DX: Nausea with vomiting, unspecified: R11.2

## 2018-09-28 HISTORY — DX: Other specified postprocedural states: Z98.890

## 2018-09-28 HISTORY — DX: Adverse effect of unspecified anesthetic, initial encounter: T41.45XA

## 2018-09-28 LAB — HCG, SERUM, QUALITATIVE: Preg, Serum: NEGATIVE

## 2018-09-28 LAB — CBC
HEMATOCRIT: 38.5 % (ref 36.0–46.0)
HEMOGLOBIN: 11.3 g/dL — AB (ref 12.0–15.0)
MCH: 24 pg — ABNORMAL LOW (ref 26.0–34.0)
MCHC: 29.4 g/dL — ABNORMAL LOW (ref 30.0–36.0)
MCV: 81.7 fL (ref 80.0–100.0)
Platelets: 522 10*3/uL — ABNORMAL HIGH (ref 150–400)
RBC: 4.71 MIL/uL (ref 3.87–5.11)
RDW: 12.9 % (ref 11.5–15.5)
WBC: 8.8 10*3/uL (ref 4.0–10.5)
nRBC: 0 % (ref 0.0–0.2)

## 2018-09-28 LAB — BASIC METABOLIC PANEL
Anion gap: 9 (ref 5–15)
BUN: 14 mg/dL (ref 6–20)
CO2: 25 mmol/L (ref 22–32)
Calcium: 9.3 mg/dL (ref 8.9–10.3)
Chloride: 106 mmol/L (ref 98–111)
Creatinine, Ser: 0.75 mg/dL (ref 0.44–1.00)
GFR calc Af Amer: >60 mL/min (ref >60)
GFR calc non Af Amer: >60 mL/min (ref >60)
Glucose, Bld: 110 mg/dL — ABNORMAL HIGH (ref 70–99)
Potassium: 4 mmol/L (ref 3.5–5.1)
Sodium: 140 mmol/L (ref 135–145)

## 2018-09-28 NOTE — Patient Instructions (Signed)
Linda Nash  09/28/2018   Your procedure is scheduled on: 09-30-18    Report to [facility] Main  Entrance    Report to Admitting at 7:00 AM    Call this number if you have problems the morning of surgery [phone]    Remember: Do not eat food or drink liquids :After Midnight.    BRUSH YOUR TEETH MORNING OF SURGERY AND RINSE YOUR MOUTH OUT, NO CHEWING GUM CANDY OR MINTS.     Take these medicines the morning of surgery with A SIP OF WATER: None                                 You may not have any metal on your body including hair pins and              piercings  Do not wear jewelry, make-up, lotions, powders or perfumes, deodorant             Do not wear nail polish.  Do not shave  48 hours prior to surgery.                Do not bring valuables to the hospital. [facility] IS NOT             RESPONSIBLE   FOR VALUABLES.  Contacts, dentures or bridgework may not be worn into surgery.      Patients discharged the day of surgery will not be allowed to drive home. IF YOU ARE HAVING SURGERY AND GOING HOME THE SAME DAY, YOU MUST HAVE AN ADULT TO DRIVE YOU HOME AND BE WITH YOU FOR 24 HOURS. YOU MAY GO HOME BY TAXI OR UBER OR ORTHERWISE, BUT AN ADULT MUST ACCOMPANY YOU HOME AND STAY WITH YOU FOR 24 HOURS.    Name and phone number of your driver: Jackqulyn Livings [phone]  Special Instructions: N/A              Please read over the following fact sheets you were given: _____________________________________________________________________             [facility] - Preparing for Surgery Before surgery, you can play an important role.  Because skin is not sterile, your skin needs to be as free of germs as possible.  You can reduce the number of germs on your skin by washing with CHG (chlorahexidine gluconate) soap before surgery.  CHG is an antiseptic cleaner which kills germs and bonds with the skin to continue killing germs even after washing. Please DO  NOT use if you have an allergy to CHG or antibacterial soaps.  If your skin becomes reddened/irritated stop using the CHG and inform your nurse when you arrive at Short Stay. Do not shave (including legs and underarms) for at least 48 hours prior to the first CHG shower.  You may shave your face/neck. Please follow these instructions carefully:  1.  Shower with CHG Soap the night before surgery and the  morning of Surgery.  2.  If you choose to wash your hair, wash your hair first as usual with your  normal  shampoo.  3.  After you shampoo, rinse your hair and body thoroughly to remove the  shampoo.  4.  Use CHG as you would any other liquid soap.  You can apply chg directly  to the skin and wash                       Gently with a scrungie or clean washcloth.  5.  Apply the CHG Soap to your body ONLY FROM THE NECK DOWN.   Do not use on face/ open                           Wound or open sores. Avoid contact with eyes, ears mouth and genitals (private parts).                       Wash face,  Genitals (private parts) with your normal soap.             6.  Wash thoroughly, paying special attention to the area where your surgery  will be performed.  7.  Thoroughly rinse your body with warm water from the neck down.  8.  DO NOT shower/wash with your normal soap after using and rinsing off  the CHG Soap.                9.  Pat yourself dry with a clean towel.            10.  Wear clean pajamas.            11.  Place clean sheets on your bed the night of your first shower and do not  sleep with pets. Day of Surgery : Do not apply any lotions/deodorants the morning of surgery.  Please wear clean clothes to the hospital/surgery center.  FAILURE TO FOLLOW THESE INSTRUCTIONS MAY RESULT IN THE CANCELLATION OF YOUR SURGERY PATIENT SIGNATURE_________________________________  NURSE  SIGNATURE__________________________________  ________________________________________________________________________

## 2018-09-30 ENCOUNTER — Other Ambulatory Visit: Payer: Self-pay

## 2018-09-30 ENCOUNTER — Ambulatory Visit (HOSPITAL_COMMUNITY)
Admission: RE | Admit: 2018-09-30 | Discharge: 2018-09-30 | Disposition: A | Discharge status: Home / Self Care | Payer: Medicaid Other | Source: Ambulatory Visit | Attending: Urology | Admitting: Urology

## 2018-09-30 ENCOUNTER — Ambulatory Visit (HOSPITAL_COMMUNITY): Payer: Medicaid Other

## 2018-09-30 ENCOUNTER — Encounter (HOSPITAL_COMMUNITY)
Admission: RE | Disposition: A | Discharge status: Home / Self Care | Payer: Self-pay | Source: Ambulatory Visit | Attending: Urology

## 2018-09-30 ENCOUNTER — Ambulatory Visit (HOSPITAL_COMMUNITY): Payer: Medicaid Other | Admitting: Certified Registered"

## 2018-09-30 ENCOUNTER — Encounter (HOSPITAL_COMMUNITY): Payer: Self-pay | Admitting: Emergency Medicine

## 2018-09-30 DIAGNOSIS — D649 Anemia, unspecified: Secondary | ICD-10-CM | POA: Diagnosis not present

## 2018-09-30 DIAGNOSIS — D72829 Elevated white blood cell count, unspecified: Secondary | ICD-10-CM | POA: Insufficient documentation

## 2018-09-30 DIAGNOSIS — N201 Calculus of ureter: Secondary | ICD-10-CM | POA: Diagnosis present

## 2018-09-30 DIAGNOSIS — Z79899 Other long term (current) drug therapy: Secondary | ICD-10-CM | POA: Insufficient documentation

## 2018-09-30 HISTORY — PX: CYSTOSCOPY/URETEROSCOPY/HOLMIUM LASER/STENT PLACEMENT: SHX6546

## 2018-09-30 SURGERY — CYSTOSCOPY/URETEROSCOPY/HOLMIUM LASER/STENT PLACEMENT
Anesthesia: General | Laterality: Left

## 2018-09-30 MED ORDER — ACETAMINOPHEN 10 MG/ML IV SOLN
INTRAVENOUS | Status: AC
  Filled 2018-09-30: qty 100

## 2018-09-30 MED ORDER — ACETAMINOPHEN 10 MG/ML IV SOLN: 1000.0000 mg | Freq: Once | INTRAVENOUS | Status: DC | PRN

## 2018-09-30 MED ORDER — LIDOCAINE 2% (20 MG/ML) 5 ML SYRINGE
INTRAMUSCULAR | Status: AC
  Filled 2018-09-30: qty 5

## 2018-09-30 MED ORDER — ACETAMINOPHEN 500 MG PO TABS
1000.0000 mg | ORAL_TABLET | Freq: Once | ORAL | Status: AC
  Administered 2018-09-30: 1000 mg via ORAL
  Filled 2018-09-30: qty 2

## 2018-09-30 MED ORDER — OXYCODONE HCL 5 MG PO TABS
5.0000 mg | ORAL_TABLET | Freq: Once | ORAL | Status: AC | PRN
  Administered 2018-09-30: 5 mg via ORAL

## 2018-09-30 MED ORDER — FENTANYL CITRATE (PF) 100 MCG/2ML IJ SOLN
INTRAMUSCULAR | Status: AC
  Filled 2018-09-30: qty 2

## 2018-09-30 MED ORDER — LIDOCAINE 2% (20 MG/ML) 5 ML SYRINGE
INTRAMUSCULAR | Status: DC | PRN
  Administered 2018-09-30: 60 mg via INTRAVENOUS

## 2018-09-30 MED ORDER — SODIUM CHLORIDE 0.9 % IV SOLN
2.0000 g | INTRAVENOUS | Status: AC
  Administered 2018-09-30: 2 g via INTRAVENOUS
  Filled 2018-09-30: qty 20

## 2018-09-30 MED ORDER — DEXAMETHASONE SODIUM PHOSPHATE 10 MG/ML IJ SOLN
INTRAMUSCULAR | Status: AC
  Filled 2018-09-30: qty 1

## 2018-09-30 MED ORDER — PROPOFOL 10 MG/ML IV BOLUS
INTRAVENOUS | Status: DC | PRN
  Administered 2018-09-30: 150 ug via INTRAVENOUS

## 2018-09-30 MED ORDER — FENTANYL CITRATE (PF) 100 MCG/2ML IJ SOLN
INTRAMUSCULAR | Status: AC
  Administered 2018-09-30: 50 ug via INTRAVENOUS
  Filled 2018-09-30: qty 2

## 2018-09-30 MED ORDER — FENTANYL CITRATE (PF) 100 MCG/2ML IJ SOLN
25.0000 ug | INTRAMUSCULAR | Status: DC | PRN
  Administered 2018-09-30 (×2): 50 ug via INTRAVENOUS

## 2018-09-30 MED ORDER — 0.9 % SODIUM CHLORIDE (POUR BTL) OPTIME
TOPICAL | Status: DC | PRN
  Administered 2018-09-30: 1000 mL

## 2018-09-30 MED ORDER — ACETAMINOPHEN 500 MG PO TABS: 1000.0000 mg | ORAL_TABLET | Freq: Once | ORAL | Status: DC | PRN

## 2018-09-30 MED ORDER — SODIUM CHLORIDE 0.9 % IR SOLN
Status: DC | PRN
  Administered 2018-09-30: 3000 mL

## 2018-09-30 MED ORDER — ONDANSETRON HCL 4 MG/2ML IJ SOLN
INTRAMUSCULAR | Status: AC
  Filled 2018-09-30: qty 2

## 2018-09-30 MED ORDER — ONDANSETRON HCL 4 MG/2ML IJ SOLN
INTRAMUSCULAR | Status: DC | PRN
  Administered 2018-09-30: 4 mg via INTRAVENOUS

## 2018-09-30 MED ORDER — FENTANYL CITRATE (PF) 100 MCG/2ML IJ SOLN
INTRAMUSCULAR | Status: DC | PRN
  Administered 2018-09-30 (×2): 50 ug via INTRAVENOUS

## 2018-09-30 MED ORDER — BELLADONNA ALKALOIDS-OPIUM 16.2-30 MG RE SUPP
RECTAL | Status: AC
  Filled 2018-09-30: qty 1

## 2018-09-30 MED ORDER — SCOPOLAMINE 1 MG/3DAYS TD PT72
1.0000 | MEDICATED_PATCH | TRANSDERMAL | Status: DC
  Administered 2018-09-30: 1.5 mg via TRANSDERMAL
  Filled 2018-09-30: qty 1

## 2018-09-30 MED ORDER — PROPOFOL 10 MG/ML IV BOLUS
INTRAVENOUS | Status: AC
  Filled 2018-09-30: qty 40

## 2018-09-30 MED ORDER — ACETAMINOPHEN 160 MG/5ML PO SOLN: 1000.0000 mg | Freq: Once | ORAL | Status: DC | PRN

## 2018-09-30 MED ORDER — DEXAMETHASONE SODIUM PHOSPHATE 10 MG/ML IJ SOLN
INTRAMUSCULAR | Status: DC | PRN
  Administered 2018-09-30: 10 mg via INTRAVENOUS

## 2018-09-30 MED ORDER — OXYCODONE HCL 5 MG PO TABS
ORAL_TABLET | ORAL | Status: AC
  Filled 2018-09-30: qty 1

## 2018-09-30 MED ORDER — OXYCODONE HCL 5 MG/5ML PO SOLN: 5.0000 mg | Freq: Once | ORAL | Status: AC | PRN

## 2018-09-30 MED ORDER — MIDAZOLAM HCL 2 MG/2ML IJ SOLN
INTRAMUSCULAR | Status: DC | PRN
  Administered 2018-09-30: 2 mg via INTRAVENOUS

## 2018-09-30 MED ORDER — LACTATED RINGERS IV SOLN
INTRAVENOUS | Status: DC
  Administered 2018-09-30: 08:00:00 via INTRAVENOUS

## 2018-09-30 MED ORDER — MIDAZOLAM HCL 2 MG/2ML IJ SOLN
INTRAMUSCULAR | Status: AC
  Filled 2018-09-30: qty 2

## 2018-09-30 SURGICAL SUPPLY — 22 items
BAG URO CATCHER STRL LF (MISCELLANEOUS) ×3 IMPLANT
BASKET STONE NCOMPASS (UROLOGICAL SUPPLIES) IMPLANT
CATH URET 5FR 28IN OPEN ENDED (CATHETERS) ×2 IMPLANT
CATH URET DUAL LUMEN 6-10FR 50 (CATHETERS) ×1 IMPLANT
CLOTH BEACON ORANGE TIMEOUT ST (SAFETY) ×3 IMPLANT
COVER WAND RF STERILE (DRAPES) IMPLANT
EXTRACTOR STONE NITINOL NGAGE (UROLOGICAL SUPPLIES) ×3 IMPLANT
FIBER LASER FLEXIVA 1000 (UROLOGICAL SUPPLIES) IMPLANT
FIBER LASER FLEXIVA 365 (UROLOGICAL SUPPLIES) ×2 IMPLANT
FIBER LASER FLEXIVA 550 (UROLOGICAL SUPPLIES) IMPLANT
FIBER LASER TRAC TIP (UROLOGICAL SUPPLIES) IMPLANT
GAUZE SPONGE 4X4 16PLY XRAY LF (GAUZE/BANDAGES/DRESSINGS) ×2 IMPLANT
GLOVE SURG SS PI 8.0 STRL IVOR (GLOVE) ×2 IMPLANT
GOWN STRL REUS W/TWL XL LVL3 (GOWN DISPOSABLE) ×3 IMPLANT
GUIDEWIRE STR DUAL SENSOR (WIRE) ×3 IMPLANT
KIT TURNOVER KIT A (KITS) ×2 IMPLANT
MANIFOLD NEPTUNE II (INSTRUMENTS) ×3 IMPLANT
PACK CYSTO (CUSTOM PROCEDURE TRAY) ×3 IMPLANT
SHEATH URETERAL 12FRX35CM (MISCELLANEOUS) ×1 IMPLANT
TUBING CONNECTING 10 (TUBING) ×2 IMPLANT
TUBING CONNECTING 10' (TUBING) ×1
TUBING UROLOGY SET (TUBING) ×3 IMPLANT

## 2018-09-30 NOTE — Transfer of Care (Signed)
Immediate Anesthesia Transfer of Care Note  Patient: Linda Nash  Procedure(s) Performed: CYSTOSCOPY LEFT URETEROSCOPY/HOLMIUM LASER/STENT REMOVAL (Left )  Patient Location: PACU  Anesthesia Type:General  Level of Consciousness: awake, alert  and oriented  Airway & Oxygen Therapy: Patient Spontanous Breathing and Patient connected to face mask oxygen  Post-op Assessment: Report given to RN and Post -op Vital signs reviewed and stable  Post vital signs: Reviewed and stable  Last Vitals:  Vitals Value Taken Time  BP 144/86 09/30/2018  9:52 AM  Temp    Pulse 78 09/30/2018  9:52 AM  Resp 18 09/30/2018  9:52 AM  SpO2 100 % 09/30/2018  9:52 AM  Vitals shown include unvalidated device data.  Last Pain:  Vitals:   09/30/18 0717  TempSrc:   PainSc: 8       Patients Stated Pain Goal: 3 (09/30/18 0717)  Complications: No apparent anesthesia complications

## 2018-09-30 NOTE — Anesthesia Procedure Notes (Signed)
Procedure Name: LMA Insertion Date/Time: 09/30/2018 9:06 AM Performed by: Sindy Guadeloupe, CRNA Pre-anesthesia Checklist: Patient identified, Emergency Drugs available, Suction available, Patient being monitored and Timeout performed Patient Re-evaluated:Patient Re-evaluated prior to induction Oxygen Delivery Method: Circle system utilized Preoxygenation: Pre-oxygenation with 100% oxygen Induction Type: IV induction Ventilation: Mask ventilation without difficulty LMA: LMA inserted LMA Size: 4.0 Tube secured with: Tape Dental Injury: Teeth and Oropharynx as per pre-operative assessment

## 2018-09-30 NOTE — Discharge Instructions (Signed)
Ureteroscopy, Care After This sheet gives you information about how to care for yourself after your procedure. Your health care provider may also give you more specific instructions. If you have problems or questions, contact your health care provider. What can I expect after the procedure? After the procedure, it is common to have:  A burning sensation when you urinate.  Blood in your urine.  Mild discomfort in the bladder area or kidney area when urinating.  Needing to urinate more often or urgently. Follow these instructions at home:  Medicines  Take over-the-counter and prescription medicines only as told by your health care provider.  If you were prescribed an antibiotic medicine, take it as told by your health care provider. Do not stop taking the antibiotic even if you start to feel better. General instructions  Donot drive for 24 hours if you were given a medicine to help you relax (sedative) during your procedure.  To relieve burning, try taking a warm bath or holding a warm washcloth over your groin.  Drink enough fluid to keep your urine clear or pale yellow. ? Drink two 8-ounce glasses of water every hour for the first 2 hours after you get home. ? Continue to drink water often at home.  You can eat what you usually do.  Keep all follow-up visits as told by your health care provider. This is important. ? If you had a tube placed to keep urine flowing (ureteral stent), ask your health care provider when you need to return to have it removed. Contact a health care provider if:  You have chills or a fever.  You have burning pain for longer than 24 hours after the procedure.  You have blood in your urine for longer than 24 hours after the procedure. Get help right away if:  You have large amounts of blood in your urine.  You have blood clots in your urine.  You have very bad pain.  You have chest pain or trouble breathing.  You are unable to urinate and you  have the feeling of a full bladder. This information is not intended to replace advice given to you by your health care provider. Make sure you discuss any questions you have with your health care provider. Document Released: 07/05/2013 Document Revised: 04/15/2016 Document Reviewed: 04/11/2016 Elsevier Interactive Patient Education  2019 [employer]   General Anesthesia, Adult, Care After This sheet gives you information about how to care for yourself after your procedure. Your health care provider may also give you more specific instructions. If you have problems or questions, contact your health care provider. What can I expect after the procedure? After the procedure, the following side effects are common:  Pain or discomfort at the IV site.  Nausea.  Vomiting.  Sore throat.  Trouble concentrating.  Feeling cold or chills.  Weak or tired.  Sleepiness and fatigue.  Soreness and body aches. These side effects can affect parts of the body that were not involved in surgery. Follow these instructions at home:  For at least 24 hours after the procedure:  Have a responsible adult stay with you. It is important to have someone help care for you until you are awake and alert.  Rest as needed.  Do not: ? Participate in activities in which you could fall or become injured. ? Drive. ? Use heavy machinery. ? Drink alcohol. ? Take sleeping pills or medicines that cause drowsiness. ? Make important decisions or sign legal documents. ? Take care  of children on your own. Eating and drinking  Follow any instructions from your health care provider about eating or drinking restrictions.  When you feel hungry, start by eating small amounts of foods that are soft and easy to digest (bland), such as toast. Gradually return to your regular diet.  Drink enough fluid to keep your urine pale yellow.  If you vomit, rehydrate by drinking water, juice, or clear broth. General  instructions  If you have sleep apnea, surgery and certain medicines can increase your risk for breathing problems. Follow instructions from your health care provider about wearing your sleep device: ? Anytime you are sleeping, including during daytime naps. ? While taking prescription pain medicines, sleeping medicines, or medicines that make you drowsy.  Return to your normal activities as told by your health care provider. Ask your health care provider what activities are safe for you.  Take over-the-counter and prescription medicines only as told by your health care provider.  If you smoke, do not smoke without supervision.  Keep all follow-up visits as told by your health care provider. This is important. Contact a health care provider if:  You have nausea or vomiting that does not get better with medicine.  You cannot eat or drink without vomiting.  You have pain that does not get better with medicine.  You are unable to pass urine.  You develop a skin rash.  You have a fever.  You have redness around your IV site that gets worse. Get help right away if:  You have difficulty breathing.  You have chest pain.  You have blood in your urine or stool, or you vomit blood. Summary  After the procedure, it is common to have a sore throat or nausea. It is also common to feel tired.  Have a responsible adult stay with you for the first 24 hours after general anesthesia. It is important to have someone help care for you until you are awake and alert.  When you feel hungry, start by eating small amounts of foods that are soft and easy to digest (bland), such as toast. Gradually return to your regular diet.  Drink enough fluid to keep your urine pale yellow.  Return to your normal activities as told by your health care provider. Ask your health care provider what activities are safe for you. This information is not intended to replace advice given to you by your health care  provider. Make sure you discuss any questions you have with your health care provider. Document Released: 10/06/2000 Document Revised: 02/13/2017 Document Reviewed: 02/13/2017 Elsevier Interactive Patient Education  2019 [employer].

## 2018-09-30 NOTE — Anesthesia Preprocedure Evaluation (Signed)
Anesthesia Evaluation  Patient identified by MRN, date of birth, ID band Patient awake    Reviewed: Allergy & Precautions, NPO status , Patient's Chart, lab work & pertinent test results  History of Anesthesia Complications (+) PONV and history of anesthetic complications  Airway Mallampati: II  TM Distance: >3 FB Neck ROM: Full    Dental no notable dental hx. (+) Teeth Intact, Dental Advisory Given   Pulmonary neg pulmonary ROS,    Pulmonary exam normal breath sounds clear to auscultation       Cardiovascular Exercise Tolerance: Good negative cardio ROS Normal cardiovascular exam Rhythm:Regular Rate:Normal     Neuro/Psych negative neurological ROS  negative psych ROS   GI/Hepatic negative GI ROS, Neg liver ROS,   Endo/Other  negative endocrine ROS  Renal/GU LEFT PROXIMAL STONE  negative genitourinary   Musculoskeletal negative musculoskeletal ROS (+)   Abdominal   Peds  Hematology  (+) Blood dyscrasia, anemia ,   Anesthesia Other Findings Day of surgery medications reviewed with the patient.  Reproductive/Obstetrics negative OB ROS                             Anesthesia Physical  Anesthesia Plan  ASA: II  Anesthesia Plan: General   Post-op Pain Management:    Induction: Intravenous  PONV Risk Score and Plan: 4 or greater and Ondansetron, Dexamethasone, Treatment may vary due to age or medical condition, Midazolam, Scopolamine patch - Pre-op and Diphenhydramine  Airway Management Planned: LMA  Additional Equipment:   Intra-op Plan:   Post-operative Plan:   Informed Consent: I have reviewed the patients History and Physical, chart, labs and discussed the procedure including the risks, benefits and alternatives for the proposed anesthesia with the patient or authorized representative who has indicated his/her understanding and acceptance.     Dental advisory  given  Plan Discussed with: CRNA  Anesthesia Plan Comments:         Anesthesia Quick Evaluation

## 2018-09-30 NOTE — Interval H&P Note (Signed)
History and Physical Interval Note:  09/30/2018 8:55 AM  Linda Nash  has presented today for surgery, with the diagnosis of LEFT PROXIMAL STONE.  The various methods of treatment have been discussed with the patient and family. After consideration of risks, benefits and other options for treatment, the patient has consented to  Procedure(s): CYSTOSCOPY LEFT URETEROSCOPY/HOLMIUM LASER/STENT EXCHANGE (Left) as a surgical intervention.  The patient's history has been reviewed, patient examined, no change in status, stable for surgery.  I have reviewed the patient's chart and labs.  Questions were answered to the patient's satisfaction.     Bjorn Pippin

## 2018-09-30 NOTE — Op Note (Signed)
Procedure: 1.  Cystoscopy with removal of left double-J stent. 2.  Left ureteroscopic stone extraction with holmium laser application.  Preop diagnosis: Left proximal ureteral stone.  Postoperative diagnosis: Same.  Surgeon: Dr. Bjorn Pippin.  Anesthesia: General.  Specimen: Stone fragments.  Drain: None.  EBL: None.  Complications: None.  Indications: Linda Nash is a 38 year old female who underwent placement of a left double-J stent earlier this month for an impacted left proximal stone with infection.  She has been on antibiotic coverage and returns now for definitive management.  Procedure: She was taken the operating room where general anesthetic was induced.  She was given Rocephin.  She was placed in lithotomy position and placed fitted with PAS hose.  Her perineum and genitalia were prepped with chlorhexidine solution.  Cystoscopy was performed using the 23 [place] scope and 30 degree lens.  Her previously placed stent was grasped and pulled to the urethral meatus.  A guidewire was passed to the kidney and the stent was removed.  The 6.5 French semirigid ureteroscope was then passed alongside the wire to the level of the stone.  The stone was grasped with an engage basket and I was able to easily move it to the mid ureter but it was too large to past intact at this point.  A 365 m holmium laser fiber was passed through the scope with the power set on 0.5 J and the frequency on 10 Hz and the stone was fragmented.  An increase in power 0.8 j was required to complete the fragmentation.  The fragments were then removed with the basket.  The ureter was then inspected to the kidney and no significant ureteral mucosal injuries were noted, no residual stone fragments were seen, and it was not felt that a replacement stent was indicated.  Her bladder was drained after the wire was removed.  She was taken down from lithotomy position, her anesthetic was reversed and she was moved recovery in stable  condition.  There were no complications.

## 2018-09-30 NOTE — OR Nursing (Signed)
Stone taken by Dr. Wrenn 

## 2018-10-01 ENCOUNTER — Encounter (HOSPITAL_COMMUNITY): Payer: Self-pay | Admitting: Urology

## 2018-10-01 NOTE — Anesthesia Postprocedure Evaluation (Signed)
Anesthesia Post Note  Patient: Linda Nash  Procedure(s) Performed: CYSTOSCOPY LEFT URETEROSCOPY/HOLMIUM LASER/STENT REMOVAL (Left )     Patient location during evaluation: PACU Anesthesia Type: General Level of consciousness: awake and alert Pain management: pain level controlled Vital Signs Assessment: post-procedure vital signs reviewed and stable Respiratory status: spontaneous breathing, nonlabored ventilation, respiratory function stable and patient connected to nasal cannula oxygen Cardiovascular status: blood pressure returned to baseline and stable Postop Assessment: no apparent nausea or vomiting Anesthetic complications: no    Last Vitals:  Vitals:   09/30/18 1044 09/30/18 1150  BP: (!) 161/81 (!) 162/75  Pulse: 65 71  Resp: 16 20  Temp: 36.7 C 36.7 C  SpO2: 98% 100%    Last Pain:  Vitals:   10/01/18 1217  TempSrc:   PainSc: 2                  [name]

## 2018-10-04 ENCOUNTER — Encounter (HOSPITAL_COMMUNITY): Admission: RE | Admit: 2018-10-04 | Payer: Self-pay | Source: Ambulatory Visit

## 2024-01-04 ENCOUNTER — Encounter (INDEPENDENT_AMBULATORY_CARE_PROVIDER_SITE_OTHER): Payer: Self-pay | Admitting: Otolaryngology

## 2024-01-04 ENCOUNTER — Ambulatory Visit (INDEPENDENT_AMBULATORY_CARE_PROVIDER_SITE_OTHER): Admitting: Otolaryngology

## 2024-01-04 VITALS — BP 158/91 | HR 71 | Ht 64.0 in | Wt 190.0 lb

## 2024-01-04 DIAGNOSIS — R591 Generalized enlarged lymph nodes: Secondary | ICD-10-CM

## 2024-01-04 NOTE — Progress Notes (Signed)
 Dear Dr. Toribio, Here is my assessment for our mutual patient, Linda Nash. Thank you for allowing me the opportunity to care for your patient. Please do not hesitate to contact me should you have any other questions. Sincerely, Dr. Eldora Blanch  Otolaryngology Clinic Note Referring provider: Dr. Toribio HPI:  Linda Nash is a 43 y.o. female kindly referred by Dr. Toribio for evaluation of left neck mass.  Initial visit (12/2023):  Noted left neck mass/likely lymph node at least since 2013. Unclear antecedent event, possibly sick at the time. She reports that multiple other lymph nodes, but got antibiotics and went down but one left sided LN persisted. Saw Dr. Lauralee, considered ECLNBx, but declined. She reports that it has stayed about the same, and no other masses have been noted. It just feels sore intermittently/rarely, and sometimes has some tightness around it when she turns her neck. No trauma to the area, no drainage. No B symptoms. Denies recent URI. Never has had this biopsied.  Patient otherwise denies: - dysphagia, odynophagia, unintentional weight loss - changes in voice, shortness of breath, hemoptysis - ear pain, other neck masses No FHX of thyroid problems. No hypothyroid or hyperthyroid symptoms.  No sinonasal sx.   H&N Surgery: no Personal or FHx of bleeding dz or anesthesia difficulty: no  GLP-1: no AP/AC: no  Tobacco: no. Alcohol: no  PMHx: HTN, MDD, Migraines  Independent Review of Additional Tests or Records:  Dr. Lauralee (07/21/2011): noted persistent LN, small LN in submandibular area, then that resolved. Given abx, including azithromycin, some tenderness. Dx: Neck LN; Rx: rec excisional biopsy, keep close eye. CBC and BMP 09/28/2018: WBC 8.8, Hgb 11.3, Plt 522; BUN/Cr 14/0.75 Dr. Toribio (09/28/2023): noted lump on neck, ref to ENT Dr. Maranda (10/26/2023): noted left anterior cervical neck mass; Noted becoming more painful with irritation. Noted small  subcutaneous nodule over SCM, possible lipoma; Dx: Neck mass; Rx: biopsy? CT Neck 10/2023 reviewed - do think left superficial lipoma v/s LN noted; no other LN noted  PMH/Meds/All/SocHx/FamHx/ROS:   Past Medical History:  Diagnosis Date   Complication of anesthesia    History of kidney stones    PONV (postoperative nausea and vomiting)    Urolithiasis      Past Surgical History:  Procedure Laterality Date   CESAREAN SECTION     CYSTOSCOPY WITH RETROGRADE PYELOGRAM, URETEROSCOPY AND STENT PLACEMENT Left 09/20/2018   Procedure: CYSTOSCOPY WITH RETROGRADE PYELOGRAM,  AND STENT PLACEMENT;  Surgeon: Watt Rush, MD;  Location: [facility];  Service: Urology;  Laterality: Left;   CYSTOSCOPY/URETEROSCOPY/HOLMIUM LASER/STENT PLACEMENT Left 09/30/2018   Procedure: CYSTOSCOPY LEFT URETEROSCOPY/HOLMIUM LASER/STENT REMOVAL;  Surgeon: Watt Rush, MD;  Location: [facility];  Service: Urology;  Laterality: Left;   REPEAT CESAREAN SECTION     REPEAT CESAREAN SECTION      Family History  Problem Relation Age of Onset   Urolithiasis Father      Social Connections: Not on file      Current Outpatient Medications:    acetaminophen  (TYLENOL ) 500 MG tablet, Take 1,000 mg by mouth as needed., Disp: , Rfl:    ibuprofen (ADVIL,MOTRIN) 200 MG tablet, Take 400 mg by mouth every 6 (six) hours as needed for headache, mild pain or moderate pain., Disp: , Rfl:    lisinopril (ZESTRIL) 10 MG tablet, Take 10 mg by mouth., Disp: , Rfl:    cefdinir  (OMNICEF ) 300 MG capsule, Take 1 capsule (300 mg total) by mouth 2 (two) times daily. (Patient not taking: Reported on  01/04/2024), Disp: 20 capsule, Rfl: 0   HYDROcodone -acetaminophen  (NORCO) 5-325 MG tablet, Take 1 tablet by mouth every 6 (six) hours as needed for moderate pain. (Patient not taking: Reported on 01/04/2024), Disp: 15 tablet, Rfl: 0   phenazopyridine  (PYRIDIUM ) 200 MG tablet, Take 1 tablet (200 mg total) by mouth 3 (three) times daily as needed for pain. (Patient  not taking: Reported on 01/04/2024), Disp: 10 tablet, Rfl: 1   Physical Exam:   BP (!) 158/91 (BP Location: Right Arm, Patient Position: Sitting, Cuff Size: Normal)   Pulse 71   Ht 5' 4 (1.626 m)   Wt 190 lb (86.2 kg)   SpO2 96%   BMI 32.61 kg/m   Salient findings:  CN II-XII intact Bilateral EAC clear and TM intact with well pneumatized middle ear spaces Anterior rhinoscopy: Septum intact; bilateral inferior turbinates without significant hypertrophy No lesions of oral cavity/oropharynx No obviously palpable neck masses/lymphadenopathy/thyromegaly EXCEPT - soft, superficial to SCM, well demarcated mass, likely lymph node; TFL was indicated to better evaluate the proximal airway, given the patient's history and exam findings, and is detailed below. No respiratory distress or stridor  Seprately Identifiable Procedures:  Prior to initiating any procedures, risks/benefits/alternatives were explained to the patient and verbal consent obtained. Procedure Note Pre-procedure diagnosis: Neck mass, rule out secondary lesion Post-procedure diagnosis: Same Procedure: Transnasal Fiberoptic Laryngoscopy, CPT 31575 - Mod 25 Indication: see above Complications: None apparent EBL: 0 mL  The procedure was undertaken to further evaluate the patient's complaint above, with mirror exam inadequate for appropriate examination due to gag reflex and poor patient tolerance  Procedure:  Patient was identified as correct patient. Verbal consent was obtained. The nose was sprayed with oxymetazoline and 4% lidocaine . The The flexible laryngoscope was passed through the nose to view the nasal cavity, pharynx (oropharynx, hypopharynx) and larynx.  The larynx was examined at rest and during multiple phonatory tasks. Documentation was obtained and reviewed with patient. The scope was removed. The patient tolerated the procedure well.  Findings: The nasal cavity and nasopharynx did not reveal any masses or lesions,  mucosa appeared to be without obvious lesions. The tongue base, pharyngeal walls, piriform sinuses, vallecula, epiglottis and postcricoid region are normal in appearance. The visualized portion of the subglottis and proximal trachea is widely patent. The vocal folds are mobile bilaterally. There are no lesions on the free edge of the vocal folds nor elsewhere in the larynx worrisome for malignancy.      Electronically signed by: Eldora KATHEE Blanch, MD 01/04/2024 2:00 PM  Impression & Plans:  Avi Archuleta is a 43 y.o. female with:  1. Head and neck lymphadenopathy    Appears to be present since at least 2013, but now bothersome. Most likely benign given time frame. No other lesions noted. D/w pt re: excision and discussed R/B/A -- she wishes to proceed but will confirm from timing standpoint. She will call if something changes.   See below regarding exact medications prescribed this encounter including dosages and route: No orders of the defined types were placed in this encounter.     Thank you for allowing me the opportunity to care for your patient. Please do not hesitate to contact me should you have any other questions.  Sincerely, Eldora Blanch, MD Otolaryngologist (ENT), [facility] Phone: [phone] Fax: [phone]  01/04/2024, 2:00 PM   MDM:  Level 4 - 99204 Complexity/Problems addressed: mod - new problem, unknown definitive diagnosis Data complexity: mod - independent review of notes,  labs, test - Morbidity: mod - decision for surgery - Prescription Drug prescribed or managed: no
